# Patient Record
Sex: Female | Born: 1965 | Race: Black or African American | Hispanic: No | Marital: Married | Smoking: Never smoker
Health system: Southern US, Community
[De-identification: ages and names within clinical notes are randomized; demographics above are authoritative.]

---

## 2015-10-09 ENCOUNTER — Encounter (HOSPITAL_COMMUNITY): Payer: Self-pay | Admitting: Emergency Medicine

## 2015-10-09 ENCOUNTER — Encounter (HOSPITAL_COMMUNITY): Payer: BLUE CROSS/BLUE SHIELD

## 2015-10-09 ENCOUNTER — Emergency Department (HOSPITAL_COMMUNITY)
Admission: EM | Admit: 2015-10-09 | Discharge: 2015-10-09 | Discharge status: Against Medical Advice | Payer: BLUE CROSS/BLUE SHIELD | Attending: Emergency Medicine | Admitting: Emergency Medicine

## 2015-10-09 DIAGNOSIS — I8390 Asymptomatic varicose veins of unspecified lower extremity: Secondary | ICD-10-CM

## 2015-10-09 DIAGNOSIS — I8391 Asymptomatic varicose veins of right lower extremity: Secondary | ICD-10-CM | POA: Insufficient documentation

## 2015-10-09 DIAGNOSIS — Z5321 Procedure and treatment not carried out due to patient leaving prior to being seen by health care provider: Secondary | ICD-10-CM | POA: Diagnosis not present

## 2015-10-09 NOTE — ED Notes (Signed)
Bed: WA08 Expected date:  Expected time:  Means of arrival:  Comments: 

## 2015-10-09 NOTE — ED Notes (Addendum)
Pt states wants to leave; Lawyer aware; pt made aware risk of leaving AMA. Pt denies pain at time of departure.

## 2015-10-09 NOTE — ED Notes (Signed)
Pt c/o bilateral leg pain for the past 3 days. Pt states she's had varicose veins but they have never been painful before. According to pt PCP recommended pt be seen incase of DVT.

## 2015-10-11 NOTE — ED Provider Notes (Signed)
CSN: [id number]     Arrival date & time 10/09/15  1021 History   First MD Initiated Contact with Patient 10/09/15 1105     Chief Complaint  Patient presents with  . Varicose Veins     (Consider location/radiation/quality/duration/timing/severity/associated sxs/prior Treatment) HPI Patient presents to the emergency department with pain in her right lower extremity and some on her left.  Patient is complaining of varicose veins being more enlarged.  Patient states that nothing seems make the condition better, but palpation makes the area, worse.  Patient states that she did not take any medications prior to arrival. The patient denies chest pain, shortness of breath, headache,blurred vision, neck pain, fever, cough, weakness, numbness, dizziness, anorexia, edema, abdominal pain, nausea, vomiting, diarrhea, rash, back pain, dysuria, hematemesis, bloody stool, near syncope, or syncope.  Patient states she stands on her feet for long periods work History reviewed. No pertinent past medical history. History reviewed. No pertinent past surgical history. No family history on file. Social History  Substance Use Topics  . Smoking status: Never Smoker   . Smokeless tobacco: None  . Alcohol Use: None   OB History    No data available     Review of Systems  All other systems negative except as documented in the HPI. All pertinent positives and negatives as reviewed in the HPI.  Allergies  Review of patient's allergies indicates no known allergies.  Home Medications   Prior to Admission medications   Medication Sig Start Date End Date Taking? Authorizing Provider  ibuprofen (ADVIL,MOTRIN) 800 MG tablet Take 800 mg by mouth every 8 (eight) hours as needed for fever, headache, mild pain, moderate pain or cramping.   Yes Historical Provider, MD   BP 144/83 mmHg  Pulse 71  Temp(Src) 98.1 F (36.7 C) (Oral)  Resp 16  SpO2 100% Physical Exam  Constitutional: She is oriented to person, place,  and time. She appears well-developed and well-nourished. No distress.  HENT:  Head: Normocephalic and atraumatic.  Mouth/Throat: Oropharynx is clear and moist.  Eyes: Pupils are equal, round, and reactive to light.  Neck: Normal range of motion. Neck supple.  Cardiovascular: Normal rate, regular rhythm and normal heart sounds.  Exam reveals no gallop and no friction rub.   No murmur heard. Pulmonary/Chest: Effort normal and breath sounds normal. No respiratory distress. She has no wheezes.  Musculoskeletal:       Legs: Neurological: She is alert and oriented to person, place, and time. She exhibits normal muscle tone. Coordination normal.  Skin: Skin is warm and dry. No rash noted. No erythema.  Psychiatric: She has a normal mood and affect. Her behavior is normal.  Nursing note and vitals reviewed.   ED Course  Procedures (including critical care time) Labs Review Labs Reviewed - No data to display  Imaging Review No results found. I have personally reviewed and evaluated these images and lab results as part of my medical decision-making.  Patient left AGAINST MEDICAL ADVICE.  She was tired of waiting for the venous Doppler.  Patient is explained the need for this test and that we cannot fully examine her without this study.  Patient is advised to return here for any worsening in her condition or she wants to be reevaluated  Charlestine NightChristopher Ajee Heasley, PA-C 10/13/15 1502  Vanetta MuldersScott Zackowski, MD 11/01/15 [phone]

## 2016-03-16 ENCOUNTER — Emergency Department (HOSPITAL_COMMUNITY)
Admission: EM | Admit: 2016-03-16 | Discharge: 2016-03-16 | Disposition: A | Discharge status: Home / Self Care | Payer: BLUE CROSS/BLUE SHIELD | Attending: Emergency Medicine | Admitting: Emergency Medicine

## 2016-03-16 ENCOUNTER — Encounter (HOSPITAL_COMMUNITY): Payer: Self-pay

## 2016-03-16 DIAGNOSIS — Y990 Civilian activity done for income or pay: Secondary | ICD-10-CM | POA: Diagnosis not present

## 2016-03-16 DIAGNOSIS — X503XXA Overexertion from repetitive movements, initial encounter: Secondary | ICD-10-CM | POA: Insufficient documentation

## 2016-03-16 DIAGNOSIS — Y929 Unspecified place or not applicable: Secondary | ICD-10-CM | POA: Diagnosis not present

## 2016-03-16 DIAGNOSIS — M79642 Pain in left hand: Secondary | ICD-10-CM | POA: Insufficient documentation

## 2016-03-16 DIAGNOSIS — Y939 Activity, unspecified: Secondary | ICD-10-CM | POA: Insufficient documentation

## 2016-03-16 DIAGNOSIS — M79641 Pain in right hand: Secondary | ICD-10-CM | POA: Insufficient documentation

## 2016-03-16 LAB — CBG MONITORING, ED: Glucose-Capillary: 96 mg/dL (ref 65–99)

## 2016-03-16 MED ORDER — DICLOFENAC SODIUM 50 MG PO TBEC: 50.0000 mg | DELAYED_RELEASE_TABLET | Freq: Two times a day (BID) | ORAL | 0 refills | Status: AC

## 2016-03-16 MED ORDER — IBUPROFEN 400 MG PO TABS
800.0000 mg | ORAL_TABLET | Freq: Once | ORAL | Status: AC
  Administered 2016-03-16: 800 mg via ORAL
  Filled 2016-03-16: qty 2

## 2016-03-16 NOTE — ED Notes (Signed)
Pt departed in NAD, refused use of wheelchair.  

## 2016-03-16 NOTE — ED Provider Notes (Signed)
[facility]EM[facility]Y DEPT Provider Note     By signing my name below, I, Earmon PhoenixJennifer Waddell, attest that this documentation has been prepared under the direction and in the presence of [facility] Leib Elahi, [place]. Electronically Signed: Earmon PhoenixJennifer Waddell, ED Scribe. 03/16/16. 8:17 PM.   History   Chief Complaint Chief Complaint  Patient presents with  . Hand Pain     The history is provided by the patient and medical records. No language interpreter was used.  Hand Pain  This is a new problem. The current episode started more than 2 days ago. The problem occurs daily. The problem has not changed since onset.Nothing aggravates the symptoms. Nothing relieves the symptoms. She has tried nothing for the symptoms.    HPI Comments:  Pamela Barrera is a 50 y.o. female who presents to the Emergency Department complaining of bilateral hand pain that began about one week ago. She reports associated tingling, numbness, warmth and swelling. Pt reports repetitive movement of the hands at work. She has not done anything to treat her symptoms. She denies modifying factors but notes the symptoms only present at night prior to going to bed. She denies fever, chills, nausea, vomiting, bruising, wounds or weakness of the hands or upper extremities. She denies any known trauma, injury or fall. She denies h/o DM.   History reviewed. No pertinent past medical history.  There are no active problems to display for this patient.   History reviewed. No pertinent surgical history.  OB History    No data available       Home Medications    Prior to Admission medications   Medication Sig Start Date End Date Taking? Authorizing Provider  diclofenac (VOLTAREN) 50 MG EC tablet Take 1 tablet (50 mg total) by mouth 2 (two) times daily. 03/16/16   Jannet Calip Orlene OchM Shalin Linders, NP  ibuprofen (ADVIL,MOTRIN) 800 MG tablet Take 800 mg by mouth every 8 (eight) hours as needed for fever, headache, mild pain, moderate pain or cramping.    Historical  Provider, MD    Family History History reviewed. No pertinent family history.  Social History Social History  Substance Use Topics  . Smoking status: Never Smoker  . Smokeless tobacco: Never Used  . Alcohol use No     Allergies   Patient has no known allergies.   Review of Systems Review of Systems  Constitutional: Negative for chills and fever.  Gastrointestinal: Negative for nausea and vomiting.  Musculoskeletal: Positive for myalgias.  Skin: Negative for color change and wound.  Neurological: Positive for numbness. Negative for weakness.       Tingling of bilateral hands.  All other systems reviewed and are negative.    Physical Exam Updated Vital Signs BP 147/83 (BP Location: Right Arm)   Pulse 62   Temp 98.4 F (36.9 C) (Oral)   Resp 18   SpO2 100%   Physical Exam  Constitutional: She is oriented to person, place, and time. She appears well-developed and well-nourished.  Eyes: EOM are normal.  Neck: Neck supple.  Cardiovascular:  Radial pulses 2+ bilaterally. Adequate circulation.  Pulmonary/Chest: Effort normal.  Abdominal: Soft. There is no tenderness.  Musculoskeletal: Normal range of motion. She exhibits no edema, tenderness or deformity.  Full flexion and extension of all fingers. Full ROM of bilateral wrists without pain. No increased warmth, erythema or red streaking. No signs of infection.  Neurological: She is alert and oriented to person, place, and time. No cranial nerve deficit.  Skin: Skin is warm and dry.  Nursing note and vitals reviewed.    ED Treatments / Results  DIAGNOSTIC STUDIES: Oxygen Saturation is 100% on RA, normal by my interpretation.   COORDINATION OF CARE: 8:12 PM- Spoke with Dr. Eudelia Bunchardama about appropriate course of treatment. Advised pt to follow up with PCP. Will order pain medication prior to discharge. Pt verbalizes understanding and agrees to plan.  Medications  ibuprofen (ADVIL,MOTRIN) tablet 800 mg (800 mg Oral  Given 03/16/16 2020)    Labs (all labs ordered are listed, but only abnormal results are displayed) Labs Reviewed  CBG MONITORING, ED   Radiology No results found.  Procedures Procedures (including critical care time)  Medications Ordered in ED Medications  ibuprofen (ADVIL,MOTRIN) tablet 800 mg (800 mg Oral Given 03/16/16 2020)     Initial Impression / Assessment and Plan / ED Course  I have reviewed the triage vital signs and the nursing notes.   Clinical Course   50 y.o. 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 Stable for d/c without focal neuro deficits and no sign of infection. Will treat with NSAIDS and patient will f/u with her PCP. Return instructions discussed.   Final Clinical Impressions(s) / ED Diagnoses   Final diagnoses:  Bilateral hand pain    New Prescriptions Discharge Medication List as of 03/16/2016  8:16 PM    START taking these medications   Details  diclofenac (VOLTAREN) 50 MG EC tablet Take 1 tablet (50 mg total) by mouth 2 (two) times daily., Starting Thu 03/16/2016, Print         [place] M Issaac Shipper, NP 03/16/16 2216    Nira ConnPedro Eduardo Cardama, MD 03/21/16 [phone]

## 2016-03-16 NOTE — ED Triage Notes (Signed)
Pt reports bilateral swelling and burning in her hands and feet worse around bed time. Pt does report spending a lot of time on her feet.

## 2016-03-16 NOTE — Discharge Instructions (Signed)
Follow up with your primary care provider. Return here for worsening symptoms.

## 2016-06-28 ENCOUNTER — Emergency Department (HOSPITAL_COMMUNITY)
Admission: EM | Admit: 2016-06-28 | Discharge: 2016-06-28 | Disposition: A | Discharge status: Home / Self Care | Payer: BLUE CROSS/BLUE SHIELD | Attending: Emergency Medicine | Admitting: Emergency Medicine

## 2016-06-28 ENCOUNTER — Encounter (HOSPITAL_COMMUNITY): Payer: Self-pay

## 2016-06-28 DIAGNOSIS — R197 Diarrhea, unspecified: Secondary | ICD-10-CM | POA: Insufficient documentation

## 2016-06-28 DIAGNOSIS — J111 Influenza due to unidentified influenza virus with other respiratory manifestations: Secondary | ICD-10-CM | POA: Insufficient documentation

## 2016-06-28 DIAGNOSIS — R05 Cough: Secondary | ICD-10-CM | POA: Diagnosis present

## 2016-06-28 DIAGNOSIS — Z79899 Other long term (current) drug therapy: Secondary | ICD-10-CM | POA: Insufficient documentation

## 2016-06-28 LAB — COMPREHENSIVE METABOLIC PANEL
ALBUMIN: 3.3 g/dL — AB (ref 3.5–5.0)
ALT: 35 U/L (ref 14–54)
ANION GAP: 9 (ref 5–15)
AST: 33 U/L (ref 15–41)
Alkaline Phosphatase: 69 U/L (ref 38–126)
BILIRUBIN TOTAL: 0.6 mg/dL (ref 0.3–1.2)
BUN: 17 mg/dL (ref 6–20)
CHLORIDE: 102 mmol/L (ref 101–111)
CO2: 24 mmol/L (ref 22–32)
Calcium: 8.6 mg/dL — ABNORMAL LOW (ref 8.9–10.3)
Creatinine, Ser: 1.31 mg/dL — ABNORMAL HIGH (ref 0.44–1.00)
GFR calc Af Amer: 54 mL/min — ABNORMAL LOW (ref >60)
GFR, EST NON AFRICAN AMERICAN: 46 mL/min — AB (ref >60)
GLUCOSE: 102 mg/dL — AB (ref 65–99)
POTASSIUM: 3.6 mmol/L (ref 3.5–5.1)
Sodium: 135 mmol/L (ref 135–145)
Total Protein: 5.7 g/dL — ABNORMAL LOW (ref 6.5–8.1)

## 2016-06-28 LAB — I-STAT BETA HCG BLOOD, ED (MC, WL, AP ONLY): I-stat hCG, quantitative: <5 m[IU]/mL (ref <5)

## 2016-06-28 LAB — CBC
HEMATOCRIT: 41.5 % (ref 36.0–46.0)
HEMOGLOBIN: 14.3 g/dL (ref 12.0–15.0)
MCH: 29.3 pg (ref 26.0–34.0)
MCHC: 34.5 g/dL (ref 30.0–36.0)
MCV: 85 fL (ref 78.0–100.0)
Platelets: 208 10*3/uL (ref 150–400)
RBC: 4.88 MIL/uL (ref 3.87–5.11)
RDW: 13.4 % (ref 11.5–15.5)
WBC: 2.8 10*3/uL — AB (ref 4.0–10.5)

## 2016-06-28 LAB — LIPASE, BLOOD: LIPASE: 29 U/L (ref 11–51)

## 2016-06-28 MED ORDER — ONDANSETRON 4 MG PO TBDP: 4.0000 mg | ORAL_TABLET | Freq: Three times a day (TID) | ORAL | 0 refills | Status: AC | PRN

## 2016-06-28 MED ORDER — BENZONATATE 100 MG PO CAPS: 100.0000 mg | ORAL_CAPSULE | Freq: Three times a day (TID) | ORAL | 0 refills | Status: AC

## 2016-06-28 NOTE — ED Provider Notes (Signed)
[facility] Provider Note   CSN: [id number] Arrival date & time: 06/28/16  1517  By signing my name below, I, [name], attest that this documentation has been prepared under the direction and in the presence of Melene Planan Tatem Fesler, DO . Electronically Signed: [name], ED Scribe. 06/28/2016. 7:48 PM.   History   Chief Complaint Chief Complaint  Patient presents with  . Diarrhea      HPI Comments:  Pamela Barrera is a 51 y.o. female who presents to the Emergency Department complaining of multiple episodes of diarrhea x 5 days. Pt reports that she has noticed blood when wiping after BMs. Pt complains of associated dizziness, dry mouth, back pain, cough, generalized body aches, nausea. No alleviating factors noted. Denies fever, vomiting, abdominal pain, rectal pain, dysuria.   History reviewed. No pertinent past medical history.  There are no active problems to display for this patient.   History reviewed. No pertinent surgical history.  OB History    No data available       Home Medications    Prior to Admission medications   Medication Sig Start Date End Date Taking? Authorizing Provider  benzonatate (TESSALON) 100 MG capsule Take 1 capsule (100 mg total) by mouth every 8 (eight) hours. 06/28/16   Melene Planan Eugenie Harewood, DO  diclofenac (VOLTAREN) 50 MG EC tablet Take 1 tablet (50 mg total) by mouth 2 (two) times daily. 03/16/16   Hope Orlene OchM Neese, NP  ibuprofen (ADVIL,MOTRIN) 800 MG tablet Take 800 mg by mouth every 8 (eight) hours as needed for fever, headache, mild pain, moderate pain or cramping.    Historical Provider, MD  ondansetron (ZOFRAN ODT) 4 MG disintegrating tablet Take 1 tablet (4 mg total) by mouth every 8 (eight) hours as needed for nausea or vomiting. 06/28/16   Melene Planan Eda Magnussen, DO    Family History No family history on file.  Social History Social History  Substance Use Topics  . Smoking status: Never Smoker  . Smokeless tobacco: Never Used  . Alcohol  use No     Allergies   Patient has no known allergies.   Review of Systems Review of Systems  Constitutional: Negative for fever.  Respiratory: Positive for cough.   Gastrointestinal: Positive for blood in stool, diarrhea and nausea. Negative for abdominal pain and vomiting.  Genitourinary: Negative for dysuria and pelvic pain.  Musculoskeletal: Positive for back pain and myalgias.  Neurological: Positive for dizziness.  All other systems reviewed and are negative.    Physical Exam Updated Vital Signs BP 105/65 (BP Location: Left Arm)   Pulse 90   Temp 99.1 F (37.3 C) (Oral)   Resp 18   Ht 5\' 11"  (1.803 m)   Wt 242 lb (109.8 kg)   SpO2 100%   BMI 33.75 kg/m   Physical Exam  Constitutional: She is oriented to person, place, and time. She appears well-developed and well-nourished. No distress.  HENT:  Head: Normocephalic and atraumatic.  Swollen turbinates, posterior nasal drip, no noted sinus ttp, tm normal bilaterally.    Eyes: EOM are normal. Pupils are equal, round, and reactive to light.  Neck: Normal range of motion. Neck supple.  Cardiovascular: Normal rate and regular rhythm.  Exam reveals no gallop and no friction rub.   No murmur heard. Pulmonary/Chest: Effort normal. She has no wheezes. She has no rales.  Abdominal: Soft. She exhibits no distension. There is no tenderness.  Musculoskeletal: She exhibits no edema or tenderness.  Neurological: She is alert  and oriented to person, place, and time.  Skin: Skin is warm and dry. She is not diaphoretic.  Psychiatric: She has a normal mood and affect. Her behavior is normal.  Nursing note and vitals reviewed.    ED Treatments / Results  DIAGNOSTIC STUDIES:  Oxygen Saturation is 100% on RA, normal by my interpretation.    COORDINATION OF CARE:  7:48 PM Will provide cough medication. Discussed treatment plan with pt at bedside and pt agreed to plan.   Labs (all labs ordered are listed, but only abnormal  results are displayed) Labs Reviewed  COMPREHENSIVE METABOLIC PANEL - Abnormal; Notable for the following:       Result Value   Glucose, Bld 102 (*)    Creatinine, Ser 1.31 (*)    Calcium 8.6 (*)    Total Protein 5.7 (*)    Albumin 3.3 (*)    GFR calc non Af Amer 46 (*)    GFR calc Af Amer 54 (*)    All other components within normal limits  CBC - Abnormal; Notable for the following:    WBC 2.8 (*)    All other components within normal limits  LIPASE, BLOOD  URINALYSIS, ROUTINE W REFLEX MICROSCOPIC  I-STAT BETA HCG BLOOD, ED ([facility], [facility], [facility] ONLY)    EKG  EKG Interpretation None       Radiology No results found.  Procedures Procedures (including critical care time)  Medications Ordered in ED Medications - No data to display   Initial Impression / Assessment and Plan / ED Course  I have reviewed the triage vital signs and the nursing notes.  Pertinent labs & imaging results that were available during my care of the patient were reviewed by me and considered in my medical decision making (see chart for details).     51 yo F With a chief complaint of an influenza-like illness. Going on for about a week. Patient having some vomiting and diarrhea as well. On exam she is well-appearing and nontoxic. No noted abdominal tenderness. Appears well-hydrated. Discharge home with symptomatically therapy.  8:08 PM:  I have discussed the diagnosis/risks/treatment options with the patient and family and believe the pt to be eligible for discharge home to follow-up with PCP. We also discussed returning to the ED immediately if new or worsening sx occur. We discussed the sx which are most concerning (e.g., sudden worsening pain, fever, inability to tolerate by mouth) that necessitate immediate return. Medications administered to the patient during their visit and any new prescriptions provided to the patient are listed below.  Medications given during this visit Medications - No data to  display   The patient appears reasonably screen and/or stabilized for discharge and I doubt any other medical condition or other [facility] requiring further screening, evaluation, or treatment in the ED at this time prior to discharge.    Final Clinical Impressions(s) / ED Diagnoses   Final diagnoses:  Influenza    New Prescriptions New Prescriptions   BENZONATATE (TESSALON) 100 MG CAPSULE    Take 1 capsule (100 mg total) by mouth every 8 (eight) hours.   ONDANSETRON (ZOFRAN ODT) 4 MG DISINTEGRATING TABLET    Take 1 tablet (4 mg total) by mouth every 8 (eight) hours as needed for nausea or vomiting.   I personally performed the services described in this documentation, which was scribed in my presence. The recorded information has been reviewed and is accurate.     Melene Plan, DO 06/28/16 2008

## 2016-06-28 NOTE — ED Notes (Signed)
C/o of fever chills and diarrhea.  Denies N/V.

## 2016-06-28 NOTE — ED Triage Notes (Signed)
Pt reports "flu like symptoms" and describes symptoms such as dizziness, bilateral leg aches, dry mouth and diarrhea onset last Friday. She also is requesting to be tested for Diabetes and to have her thyroid levels checked.

## 2016-06-28 NOTE — Discharge Instructions (Signed)
Take tylenol 2 pills 4 times a day and motrin 4 pills 3 times a day.  Drink plenty of fluids.  Return for worsening shortness of breath, headache, confusion. Follow up with your family doctor.   

## 2017-04-24 ENCOUNTER — Other Ambulatory Visit: Payer: Self-pay

## 2017-04-24 ENCOUNTER — Emergency Department (HOSPITAL_COMMUNITY)
Admission: EM | Admit: 2017-04-24 | Discharge: 2017-04-24 | Disposition: A | Discharge status: Home / Self Care | Payer: BLUE CROSS/BLUE SHIELD | Attending: Emergency Medicine | Admitting: Emergency Medicine

## 2017-04-24 ENCOUNTER — Emergency Department (HOSPITAL_COMMUNITY): Payer: BLUE CROSS/BLUE SHIELD

## 2017-04-24 ENCOUNTER — Ambulatory Visit (HOSPITAL_BASED_OUTPATIENT_CLINIC_OR_DEPARTMENT_OTHER)
Admit: 2017-04-24 | Discharge: 2017-04-24 | Disposition: A | Discharge status: Home / Self Care | Payer: BLUE CROSS/BLUE SHIELD

## 2017-04-24 ENCOUNTER — Encounter (HOSPITAL_COMMUNITY): Payer: Self-pay

## 2017-04-24 DIAGNOSIS — M79609 Pain in unspecified limb: Secondary | ICD-10-CM | POA: Diagnosis not present

## 2017-04-24 DIAGNOSIS — M79661 Pain in right lower leg: Secondary | ICD-10-CM | POA: Diagnosis not present

## 2017-04-24 DIAGNOSIS — G8929 Other chronic pain: Secondary | ICD-10-CM | POA: Insufficient documentation

## 2017-04-24 DIAGNOSIS — Z79899 Other long term (current) drug therapy: Secondary | ICD-10-CM | POA: Insufficient documentation

## 2017-04-24 DIAGNOSIS — M25561 Pain in right knee: Secondary | ICD-10-CM | POA: Diagnosis not present

## 2017-04-24 DIAGNOSIS — R609 Edema, unspecified: Secondary | ICD-10-CM | POA: Diagnosis not present

## 2017-04-24 MED ORDER — DICLOFENAC SODIUM 1 % TD GEL: 2.0000 g | Freq: Four times a day (QID) | TRANSDERMAL | 0 refills | Status: AC

## 2017-04-24 MED ORDER — IBUPROFEN 200 MG PO TABS
600.0000 mg | ORAL_TABLET | Freq: Once | ORAL | Status: AC
  Administered 2017-04-24: 600 mg via ORAL
  Filled 2017-04-24: qty 1

## 2017-04-24 NOTE — ED Provider Notes (Signed)
Patient placed in Quick Look pathway, seen and evaluated   Chief Complaint: Swelling  HPI:   52 year old female presents today with swelling to her right lower extremity.  She notes 5922-month history of calf pain, cramping, swelling and pain to the knee.  She notes a distant history of the same but is unable to recall her diagnosis.  She denies any history of DVT or PE, she denies any significant risk factors for the same.    ROS: Edema (one)  Physical Exam:   Gen: No distress  Neuro: Awake and Alert  Skin: Warm    Focused Exam: Swelling noted to the right calf with tenderness to palpation, distal extremity warm well perfused no redness or warmth to touch   Initiation of care has begun. The patient has been counseled on the process, plan, and necessity for staying for the completion/evaluation, and the remainder of the medical screening examination    Rosalio LoudHedges, Merri Dimaano, PA-C 04/24/17 1437    Jacalyn LefevreHaviland, Julie, MD 04/24/17 [phone]

## 2017-04-24 NOTE — Discharge Instructions (Signed)
It was my pleasure taking care of you today!   Please see the information and instructions below regarding your visit.  Your diagnoses today include:  1. Chronic pain of right knee   2. Right calf pain    Your examination and testing is reassuring today.  There is no evidence of a clot in your vein.  There is no evidence of a ruptured Baker's cyst in the back of your knee.  Tests performed today include: See side panel of your discharge paperwork for testing performed today. Vital signs are listed at the bottom of these instructions.   Medications prescribed:    Take any prescribed medications only as prescribed, and any over the counter medications only as directed on the packaging.  Diclofenac as needed for pain.  Please do not apply the gluconate gel the same time you are using the ibuprofen.  This will increase the risk of toxicity from NSAIDs.  Ice and elevate knee throughout the day.  You may take Tylenol 650 mg every 6 hours as needed for pain.  Do not exceed 4 g of Tylenol 1 day.  Home care instructions:  Please follow any educational materials contained in this packet.   Please elevate your leg at the end of the day.  Follow-up instructions: Please follow-up with your primary care provider as soon as possible for further evaluation of your symptoms if they are not completely improved.   To find a primary care or specialty doctor please call [phone] or [phone] to access "[place] Find a Doctor Service."  You may also go on the [facility] website at [email or web address]  There are also multiple [facility] practices throughout the [place] that are frequently accepting new patients. You may find a clinic that is close to your home and contact them.  [facility] - [address] [phone]  [facility] in [place] (also locations in [place] and [place])  - [address] [place] [phone]  [facility] - [address] [phone]    Please follow-up with your orthopedic physician as soon as possible I listed in this paperwork.  He may need additional imaging of your knee or evaluation.  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  Call the orthopedist listed today or tomorrow to schedule follow up appointment for recheck of ongoing knee pain in one to two weeks. That appointment can be canceled with a 24-48 hour notice if complete resolution of pain.  Return instructions:  Please return to the Emergency Department if you experience worsening symptoms.  Please return for any increasing swelling, increasing pain, loss of color to the lower leg, or increasing redness. Please return if you have any other emergent concerns.  Additional Information:   Your vital signs today were: BP (!) 144/85 (BP Location: Left Arm)    Pulse 71    Temp 98.6 F (37 C) (Oral)    Resp 18    SpO2 100%  If your blood pressure (BP) was elevated on multiple readings during this visit above 130 for the top number or above 80 for the bottom number, please have this repeated by your primary care provider within one month. --------------

## 2017-04-24 NOTE — ED Notes (Signed)
Patient given discharge instructions and verbalized understanding.  Patient stable to discharge at this time.  Patient is alert and oriented to baseline.  No distressed noted at this time.  All belongings taken with the patient at discharge.   

## 2017-04-24 NOTE — ED Triage Notes (Signed)
Pt reports knot and pain behind right knee that radiates down into calf x several months. Pt endorses swelling to calf. Pain is constant and has gotten to the point pt is unable to sleep. Denies taking blood thinners

## 2017-04-24 NOTE — ED Provider Notes (Signed)
[facility] EMERGENCY DEPARTMENT Provider Note   CSN: [id number] Arrival date & time: 04/24/17  1407     History   Chief Complaint Chief Complaint  Patient presents with  . Knee Pain    HPI Pamela Barrera is a 52 y.o. female.  HPI   Patient is a 52 year old female with a history of plantar fasciitis, varicose veins  presenting for 3 months of right knee pain.  Patient reports is progressively worsening.  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  Patient has pain to both anterior and posterior right knee in the popliteal region.  Additionally, patient notes that the posterior calf of the right lower extremity has been sore over the past 3 months.  Patient reports she has noticed that at times her right knee is more swollen around the medial side of her right knee, but without diffuse erythema.  Patient reports that she has noticed increase in size and extent of her varicose veins.  Patient is also noticed that the skin around her lateral right ankle has been slightly darker, but she has noticed no erythema distal to the right knee.  Patient denies fever, chills, loss of distal sensation of the right lower extremity, swelling to the calf of the right lower extremity.  Patient denies estrogen use, history of DVT/PE, recent hospitalization, recent surgery, recent immobilization, shortness of breath, chest pain, palpitations.  History reviewed. No pertinent past medical history.  There are no active problems to display for this patient.   History reviewed. No pertinent surgical history.  OB History    No data available       Home Medications    Prior to Admission medications   Medication Sig Start Date End Date Taking? Authorizing Provider  benzonatate (TESSALON) 100 MG capsule Take 1 capsule (100 mg total) by mouth every 8 (eight) hours. 06/28/16   Melene Plan, DO  diclofenac  (VOLTAREN) 50 MG EC tablet Take 1 tablet (50 mg total) by mouth 2 (two) times daily. 03/16/16   Janne Napoleon, NP  ibuprofen (ADVIL,MOTRIN) 800 MG tablet Take 800 mg by mouth every 8 (eight) hours as needed for fever, headache, mild pain, moderate pain or cramping.    [provider]  ondansetron (ZOFRAN ODT) 4 MG disintegrating tablet Take 1 tablet (4 mg total) by mouth every 8 (eight) hours as needed for nausea or vomiting. 06/28/16   Melene Plan, DO    Family History No family history on file.  Social History Social History   Tobacco Use  . Smoking status: Never Smoker  . Smokeless tobacco: Never Used  Substance Use Topics  . Alcohol use: No  . Drug use: Not on file     Allergies   Patient has no known allergies.   Review of Systems Review of Systems  Constitutional: Negative for chills and fever.  Respiratory: Negative for shortness of breath.   Cardiovascular: Positive for leg swelling. Negative for chest pain.  Musculoskeletal: Positive for arthralgias and joint swelling.  Skin: Positive for color change.  All other systems reviewed and are negative.    Physical Exam Updated Vital Signs BP (!) 144/85 (BP Location: Left Arm)   Pulse 71   Temp 98.6 F (37 C) (Oral)   Resp 18   SpO2 100%   Physical Exam  Constitutional: She appears well-developed and well-nourished. No distress.  HENT:  Head: Normocephalic  and atraumatic.  Mouth/Throat: Oropharynx is clear and moist.  Eyes: Conjunctivae and EOM are normal. Pupils are equal, round, and reactive to light.  Neck: Normal range of motion. Neck supple.  Cardiovascular: Normal rate, regular rhythm, S1 normal and S2 normal.  No murmur heard. Pulmonary/Chest: Effort normal and breath sounds normal. She has no wheezes. She has no rales.  Abdominal: She exhibits no distension.  Musculoskeletal: Normal range of motion. She exhibits no edema or deformity.  Right knee without erythema or obvious effusion.  Right  knee with tenderness to palpation of medial lateral patella. Full ROM.  No joint line tenderness. No joint effusion or swelling appreciated. No abnormal alignment or patellar mobility. No bruising, erythema or warmth overlaying the joint. No varus/valgus laxity. Negative drawer's, Lachman's and McMurray's.  No crepitus.  2+ DP pulses bilaterally. All compartments are soft. Sensation intact distal to injury.  Patient does exhibit some calf tenderness of the right lower extremity.  Compartments of the right lower extremity are soft.  Lymphadenopathy:    She has no cervical adenopathy.  Neurological: She is alert.  Cranial nerves grossly intact. Patient moves extremities symmetrically and with good coordination.  Skin: Skin is warm and dry. No rash noted. No erythema.  There is superficial spider veins on bilateral calves and thighs.  Psychiatric: She has a normal mood and affect. Her behavior is normal. Judgment and thought content normal.  Nursing note and vitals reviewed.    ED Treatments / Results  Labs (all labs ordered are listed, but only abnormal results are displayed) Labs Reviewed - No data to display  EKG  EKG Interpretation None       Radiology No results found.  Procedures Procedures (including critical care time)  Medications Ordered in ED Medications - No data to display   Initial Impression / Assessment and Plan / ED Course  I have reviewed the triage vital signs and the nursing notes.  Pertinent labs & imaging results that were available during my care of the patient were reviewed by me and considered in my medical decision making (see chart for details).     Final Clinical Impressions(s) / ED Diagnoses   Final diagnoses:  Chronic pain of right knee  Right calf pain   Patient is nontoxic-appearing, afebrile, and in no acute distress.  Patient exhibits chronic right anterior and posterior knee pain.  Due to the popliteal nature of the pain, patient will  receive a DVT ultrasound.  There is no evidence of acute arterial occlusion as patient is completely neurovascularly intact in the right lower extremity.  Clinically there is no erythema, edema, or loss of range of motion to suggest septic arthritis.  Will also obtain right knee x-ray to assess for extent of effusion.  Lower extremity ultrasound is negative for ruptured Baker's cyst or DVT.  X-ray shows a small suprapatellar effusion, that appears chronic in nature.  There is also some chronic degenerative changes of the patella as well as the medial joint space narrowing.  Will place patient in knee sleeve and have her apply diclofenac gel.  I instructed patient that she will require follow-up with orthopedic physician.  Patient has never seen orthopedic physician about her knee.  Additionally, patient to follow-up with her primary care provider.  Patient given return precautions for any worsening swelling, erythema, pain, distal pallor or paresthesias.  ED Discharge Orders    None       Delia ChimesMurray, Mayu Ronk B, PA-C 04/25/17 0309    Jacubowitz,  Sam, MD 04/26/17 [phone]

## 2017-04-24 NOTE — Progress Notes (Signed)
*  PRELIMINARY RESULTS* Vascular Ultrasound Right lower extremity venous duplex has been completed.  Preliminary findings: No evidence of deep vein thrombosis or baker's cyst in the right lower extremity.  Moderate amount of fluid noted anterior and lateral to right knee, area of complaint.   Pamela FischerCharlotte C Ruthene Methvin 04/24/2017, 4:15 PM

## 2017-04-26 ENCOUNTER — Telehealth (INDEPENDENT_AMBULATORY_CARE_PROVIDER_SITE_OTHER): Payer: Self-pay | Admitting: Orthopedic Surgery

## 2017-04-26 NOTE — Telephone Encounter (Signed)
Patient called advised she went to the ER 04/24/17 and Dr August Saucerean was on call. Patient asked if she can be worked into his schedule as soon as possible. Patient advised her right knee and calf is swollen really bad. She advised both ankles is very dark (black) The number to contact patient is [phone]

## 2017-04-26 NOTE — Telephone Encounter (Signed)
Patient would like an appointment after 3:30pm because of her work. The have a point system.

## 2017-04-27 NOTE — Telephone Encounter (Signed)
I tried calling patient back about appointment. No answer. LM for her that was returning call to discuss. She can come to Dr Alfonso Patteneans first available on 02/01 next Friday, if needing after 3pm appt will have schedule at next available time.

## 2017-05-04 ENCOUNTER — Ambulatory Visit (INDEPENDENT_AMBULATORY_CARE_PROVIDER_SITE_OTHER): Payer: Self-pay | Admitting: Orthopedic Surgery

## 2017-05-09 ENCOUNTER — Ambulatory Visit (INDEPENDENT_AMBULATORY_CARE_PROVIDER_SITE_OTHER): Payer: BLUE CROSS/BLUE SHIELD | Admitting: Orthopedic Surgery

## 2018-04-11 IMAGING — DX DG KNEE COMPLETE 4+V*R*
4 series · 4 of 4 positions shown · non-contrast
Comparison: None.

CLINICAL DATA: Lateral right knee pain.

EXAM:
RIGHT KNEE - COMPLETE 4+ VIEW

[x knee ap right]
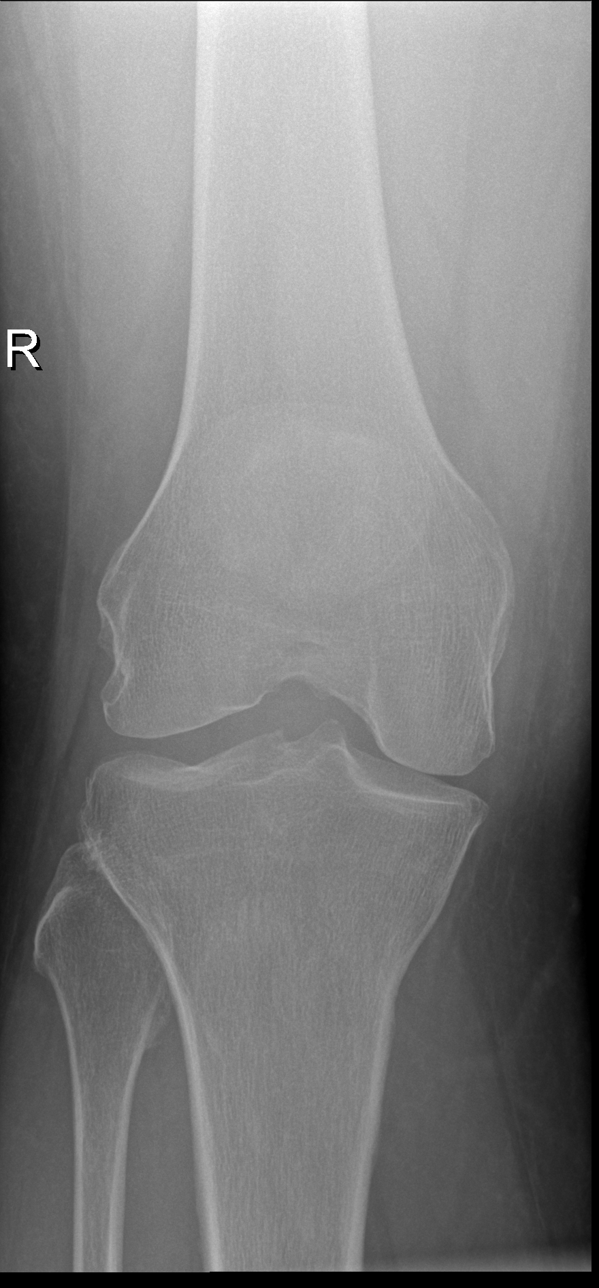

[x knee obl right (1 of 2)]
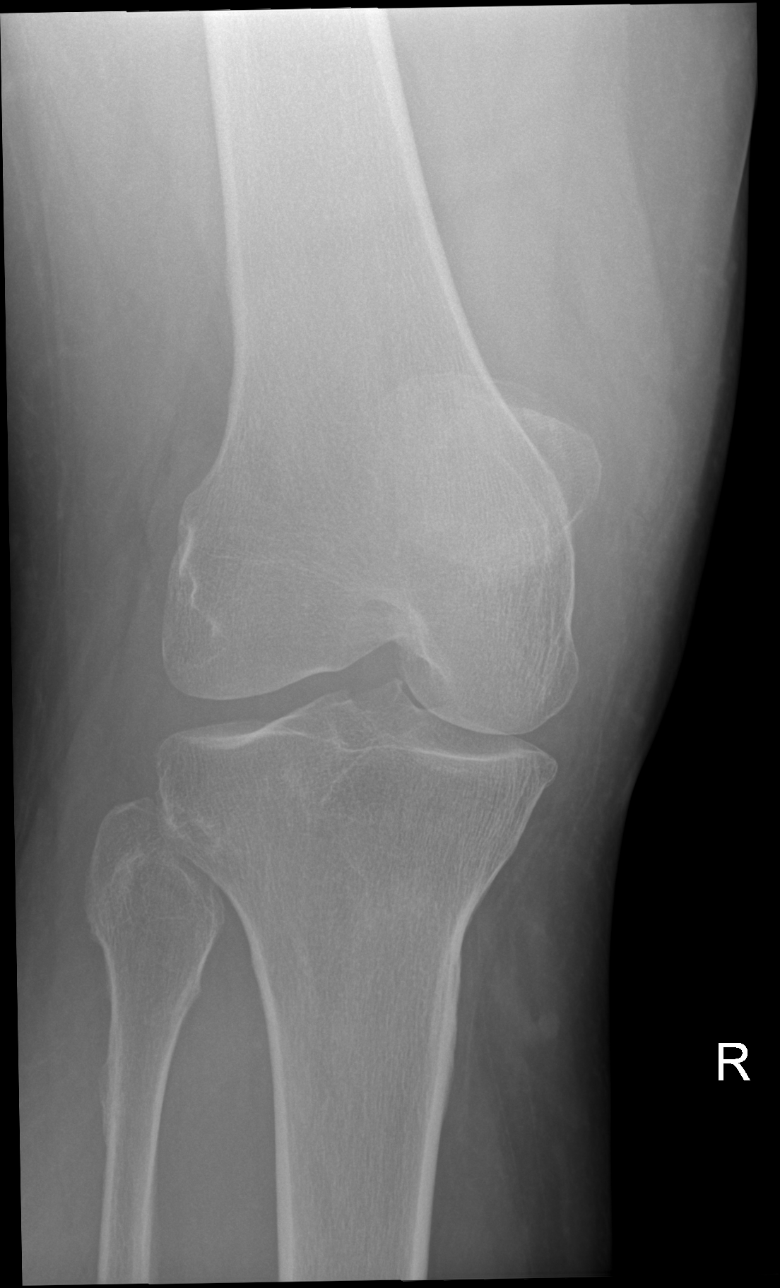

[x knee obl right (2 of 2)]
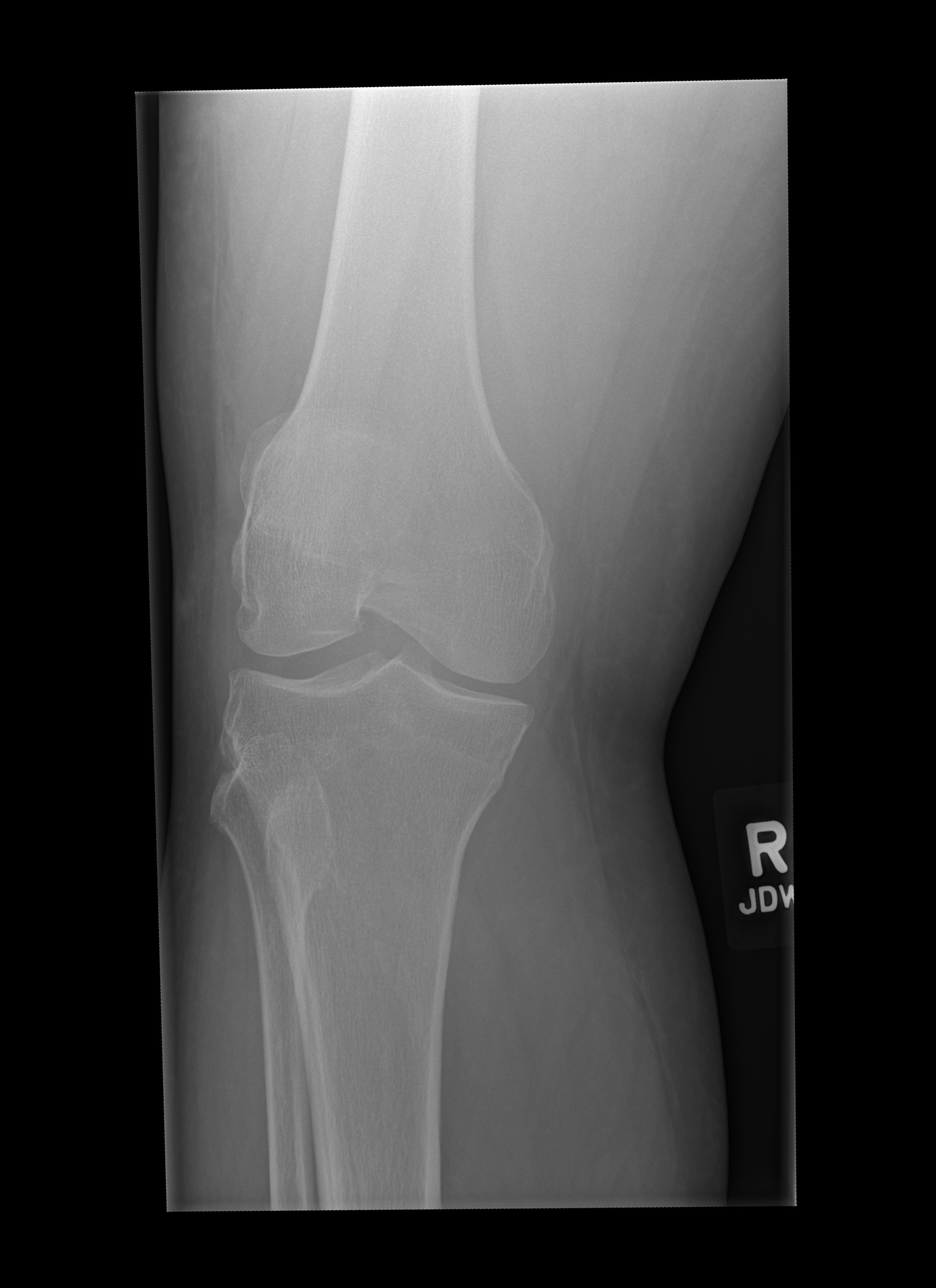

[x knee lat right]
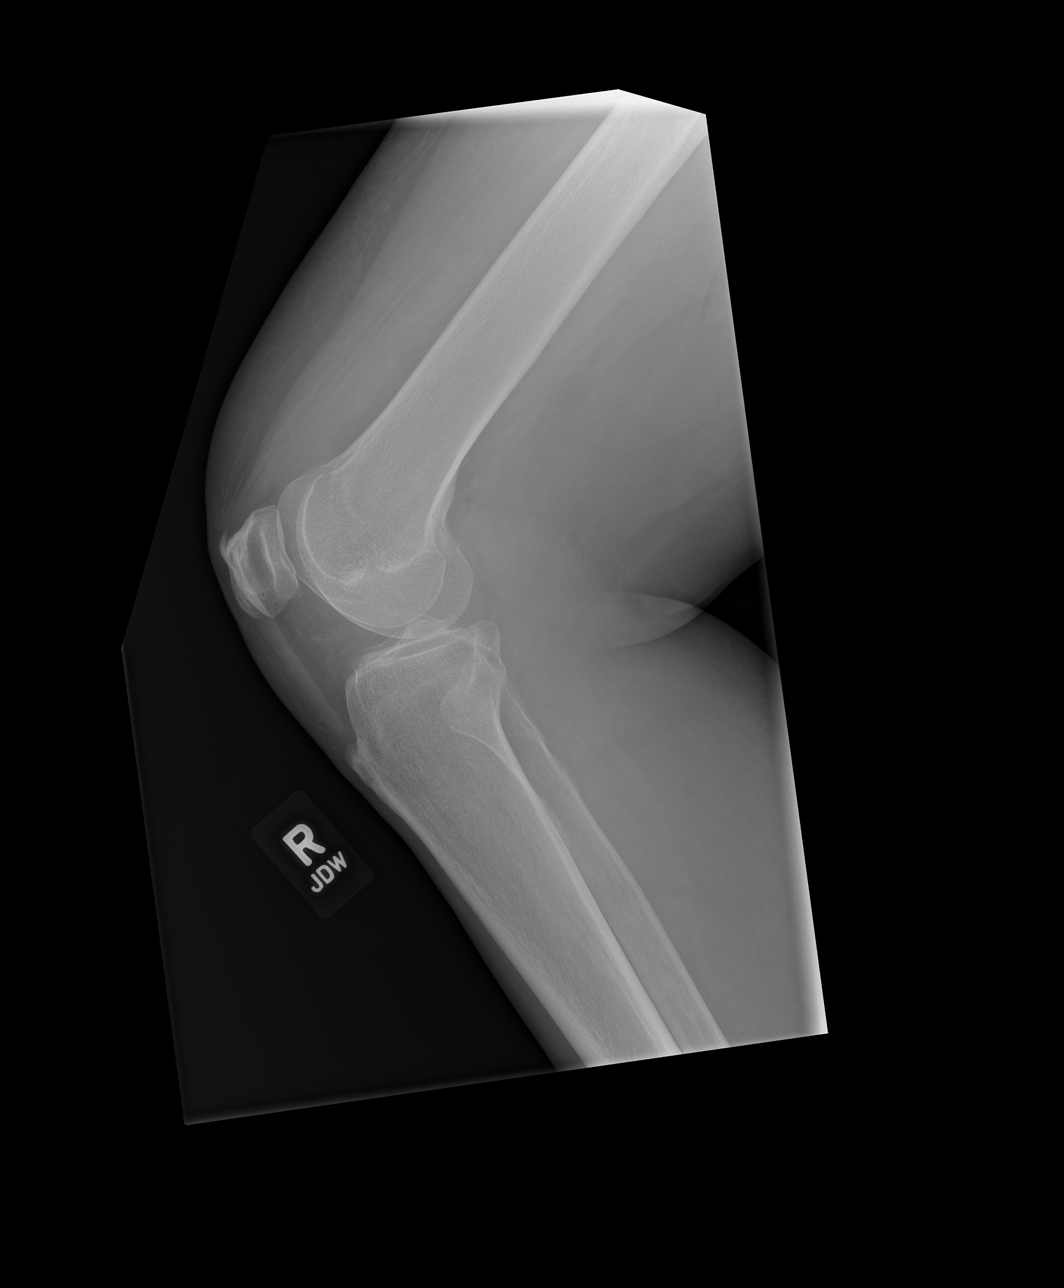

[4 of 4 positions shown; findings below may reference images not displayed]

FINDINGS: Evidence for small suprapatellar joint effusion. Negative for
fracture or dislocation. Enthesopathic changes at the patella. Mild
medial joint space narrowing. Spurring at the patellofemoral
compartment of the knee.
IMPRESSION: No acute abnormality.

Mild degenerative changes with small joint effusion.
